# Patient Record
Sex: Male | Born: 1995 | Marital: Single | Smoking: Never smoker
Health system: Southern US, Community
[De-identification: ages and names within clinical notes are randomized; demographics above are authoritative.]

---
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
## 2016-01-07 DIAGNOSIS — M25672 Stiffness of left ankle, not elsewhere classified: Secondary | ICD-10-CM | POA: Diagnosis not present

## 2016-01-07 DIAGNOSIS — M25572 Pain in left ankle and joints of left foot: Secondary | ICD-10-CM | POA: Diagnosis not present

## 2016-01-14 DIAGNOSIS — M25572 Pain in left ankle and joints of left foot: Secondary | ICD-10-CM | POA: Diagnosis not present

## 2016-01-14 DIAGNOSIS — M25672 Stiffness of left ankle, not elsewhere classified: Secondary | ICD-10-CM | POA: Diagnosis not present

## 2016-01-17 DIAGNOSIS — M25672 Stiffness of left ankle, not elsewhere classified: Secondary | ICD-10-CM | POA: Diagnosis not present

## 2016-01-17 DIAGNOSIS — M25572 Pain in left ankle and joints of left foot: Secondary | ICD-10-CM | POA: Diagnosis not present

## 2016-01-21 DIAGNOSIS — M25672 Stiffness of left ankle, not elsewhere classified: Secondary | ICD-10-CM | POA: Diagnosis not present

## 2016-01-21 DIAGNOSIS — M25572 Pain in left ankle and joints of left foot: Secondary | ICD-10-CM | POA: Diagnosis not present

## 2016-01-24 DIAGNOSIS — M25672 Stiffness of left ankle, not elsewhere classified: Secondary | ICD-10-CM | POA: Diagnosis not present

## 2016-01-24 DIAGNOSIS — M25572 Pain in left ankle and joints of left foot: Secondary | ICD-10-CM | POA: Diagnosis not present

## 2016-01-28 DIAGNOSIS — M25572 Pain in left ankle and joints of left foot: Secondary | ICD-10-CM | POA: Diagnosis not present

## 2016-01-28 DIAGNOSIS — M25672 Stiffness of left ankle, not elsewhere classified: Secondary | ICD-10-CM | POA: Diagnosis not present

## 2016-01-31 DIAGNOSIS — M25672 Stiffness of left ankle, not elsewhere classified: Secondary | ICD-10-CM | POA: Diagnosis not present

## 2016-01-31 DIAGNOSIS — M25572 Pain in left ankle and joints of left foot: Secondary | ICD-10-CM | POA: Diagnosis not present

## 2016-02-04 DIAGNOSIS — M25572 Pain in left ankle and joints of left foot: Secondary | ICD-10-CM | POA: Diagnosis not present

## 2016-02-04 DIAGNOSIS — M25672 Stiffness of left ankle, not elsewhere classified: Secondary | ICD-10-CM | POA: Diagnosis not present

## 2016-11-09 DIAGNOSIS — R05 Cough: Secondary | ICD-10-CM | POA: Diagnosis not present

## 2016-11-09 DIAGNOSIS — J069 Acute upper respiratory infection, unspecified: Secondary | ICD-10-CM | POA: Diagnosis not present

## 2016-12-07 ENCOUNTER — Encounter: Payer: Self-pay | Admitting: Sports Medicine

## 2016-12-07 ENCOUNTER — Ambulatory Visit (INDEPENDENT_AMBULATORY_CARE_PROVIDER_SITE_OTHER): Payer: BLUE CROSS/BLUE SHIELD | Admitting: Sports Medicine

## 2016-12-07 ENCOUNTER — Ambulatory Visit (INDEPENDENT_AMBULATORY_CARE_PROVIDER_SITE_OTHER): Payer: BLUE CROSS/BLUE SHIELD

## 2016-12-07 VITALS — BP 112/70 | HR 68 | Ht 73.0 in | Wt 193.6 lb

## 2016-12-07 DIAGNOSIS — G8929 Other chronic pain: Secondary | ICD-10-CM | POA: Diagnosis not present

## 2016-12-07 DIAGNOSIS — M25572 Pain in left ankle and joints of left foot: Secondary | ICD-10-CM

## 2016-12-07 NOTE — Patient Instructions (Signed)
We will plan to call you with results of the MRI.  Please see office from any significant activity until we have more information from the MRI.  You will need to call to arrange his back home.

## 2016-12-07 NOTE — Progress Notes (Signed)
OFFICE VISIT NOTE Richard FellsMichael D. Delorise Shinerigby, DO  [place] [facility] [place] [phone]  Richard Burns - 21 y.o. male MRN [id number]  Date of birth: 1996/01/27  Visit Date: 12/07/2016  PCP: No primary care provider on file.   Referred by: No ref. provider found  Richard Burns, CMA acting as scribe for Dr. Berline Choughigby.  SUBJECTIVE:   Chief Complaint  Patient presents with  . left ankle pain   HPI: As below and per problem based documentation when appropriate.  Pt presents today with complaint of pain in the left ankle.  Pain started about 4 months ago d/t an injury while playing baseball. Pt rolled his ankle while stepping back. The pain in on the medial aspect of the left ankle. He reports mild swelling around the ankle.  Pt reports not recent xray of the ankle.   The pain is described as stabbing pain and is rated as 0/10 currently but 7/10 when running.  Worsened with running and jogging.  Improves with rest Therapies tried include icing the ankle and taping the ankle, he gets no relief from this. He also takes Ibuprofen and gets some relief.   Other associated symptoms include: Pt denies pain in foot, leg, knee, hip.   Pt denies fever, chills, night sweat, unintentional weight gain. He reports that he did lose 10 lbs over this past season but that is normal for him.     Review of Systems  Constitutional: Negative for chills and fever.  Respiratory: Negative for shortness of breath and wheezing.   Cardiovascular: Negative for chest pain, palpitations and leg swelling.  Musculoskeletal: Negative for falls.  Neurological: Negative for dizziness, tingling and headaches.  Endo/Heme/Allergies: Does not bruise/bleed easily.    Otherwise per HPI.  HISTORY & PERTINENT PRIOR DATA:  No specialty comments available. He reports that he has never smoked. He has never used smokeless tobacco. No results for input(s): HGBA1C, LABURIC in the last 8760  hours. Medications & Allergies reviewed per EMR Patient Active Problem List   Diagnosis Date Noted  . Chronic pain of left ankle 12/07/2016   History reviewed. No pertinent past medical history. History reviewed. No pertinent family history. History reviewed. No pertinent surgical history. Social History   Occupational History  . Not on file.   Social History Main Topics  . Smoking status: Never Smoker  . Smokeless tobacco: Never Used  . Alcohol use Not on file  . Drug use: Unknown  . Sexual activity: Not on file    OBJECTIVE:  VS:  HT:6\' 1"  (185.4 cm)   WT:193 lb 9.6 oz (87.8 kg)  BMI:25.6    BP:112/70  HR:68bpm  TEMP: ( )  RESP:97 % EXAM: Findings:  WDWN, NAD, Non-toxic appearing Alert & appropriately interactive Not depressed or anxious appearing No increased work of breathing. Pupils are equal. EOM intact without nystagmus No clubbing or cyanosis of the extremities appreciated No significant rashes/lesions/ulcerations overlying the examined area. DP & PT pulses 2+/4.  No significant pretibial edema. Sensation intact to light touch in lower extremities.  Left ankle: Overall moderately well aligned although he does have a midfoot valgus deformity with prominence of the medial malleolus.  He is a small amount of pain over the anterior medial ankle and crepitation that is painful with circumduction of the ankle.  He is stable to anterior drawer testing as well as talar tilt or Kleiger testing.  No pain with cotton testing.  Does have pain with resisted ankle  inversion but strength is 5/5 with dorsiflexion, plantarflexion, inversion and eversion.  Complete x-rays of the left ankle reviewed today personally by myself that show a radiolucency within the medial malleolus and possible cortical irregularity at the medial cornus of the talar dome.      No results found. ASSESSMENT & PLAN:   Problem List Items Addressed This Visit    Chronic pain of left ankle - Primary     Unclear etiology of 4 months of worsening left medial ankle pain.  There is a slight irregularity on the x-ray of the ankle today and evaluation with MRI is warranted at this time given the significant mechanical symptoms that he is experiencing in the persistent pain in spite of lack of conservative measures including anti-inflammatories, bracing and physical therapy.  We will plan to touch base with him by telephone regarding the results as he is returning home over the summer and will need to have the MRI arranged through his local medical facility.      Relevant Orders   DG Ankle Complete Left   MR ANKLE LEFT WO CONTRAST      Follow-up: Return if symptoms worsen or fail to improve, for Will call for results for the MRI since he is going home..   CMA/ATC served as Neurosurgeon during this visit. History, Physical, and Plan performed by medical provider. Documentation and orders reviewed and attested to.      Gaspar Bidding, DO    [facility]

## 2016-12-07 NOTE — Assessment & Plan Note (Signed)
Unclear etiology of 4 months of worsening left medial ankle pain.  There is a slight irregularity on the x-ray of the ankle today and evaluation with MRI is warranted at this time given the significant mechanical symptoms that he is experiencing in the persistent pain in spite of lack of conservative measures including anti-inflammatories, bracing and physical therapy.  We will plan to touch base with him by telephone regarding the results as he is returning home over the summer and will need to have the MRI arranged through his local medical facility.

## 2016-12-10 ENCOUNTER — Telehealth: Payer: Self-pay | Admitting: Sports Medicine

## 2016-12-10 NOTE — Telephone Encounter (Signed)
Victorino [facility]ennifer [facility] to speak to [facility] about [facility] for patient. Transferred to [place].

## 2016-12-10 NOTE — Telephone Encounter (Signed)
Order needed to be signed and dated by Dr Berline Choughigby. New order given to [facility].

## 2016-12-14 DIAGNOSIS — M25572 Pain in left ankle and joints of left foot: Secondary | ICD-10-CM | POA: Diagnosis not present

## 2016-12-14 DIAGNOSIS — G8929 Other chronic pain: Secondary | ICD-10-CM | POA: Diagnosis not present

## 2016-12-15 ENCOUNTER — Ambulatory Visit: Payer: No Typology Code available for payment source | Admitting: Sports Medicine

## 2016-12-21 ENCOUNTER — Encounter: Payer: Self-pay | Admitting: Sports Medicine

## 2016-12-21 ENCOUNTER — Ambulatory Visit (INDEPENDENT_AMBULATORY_CARE_PROVIDER_SITE_OTHER): Payer: BLUE CROSS/BLUE SHIELD | Admitting: Sports Medicine

## 2016-12-21 VITALS — BP 122/80 | HR 80 | Ht 73.0 in | Wt 198.2 lb

## 2016-12-21 DIAGNOSIS — G8929 Other chronic pain: Secondary | ICD-10-CM

## 2016-12-21 DIAGNOSIS — M216X2 Other acquired deformities of left foot: Secondary | ICD-10-CM

## 2016-12-21 DIAGNOSIS — M25572 Pain in left ankle and joints of left foot: Secondary | ICD-10-CM | POA: Diagnosis not present

## 2016-12-21 DIAGNOSIS — T148XXA Other injury of unspecified body region, initial encounter: Secondary | ICD-10-CM

## 2016-12-21 NOTE — Patient Instructions (Signed)
Try using a compression sock or sleeve on the ankle.  We gave you a sample of the Foot control sock from [employer].  Please let me know how this feels and does for you.  Get [employer] orthotics through [employer] [facility]  Okay to start PT with focus on intrinsic foot muscles and modalities

## 2016-12-21 NOTE — Progress Notes (Addendum)
OFFICE VISIT NOTE Veverly FellsMichael D. Delorise Shinerigby, DO  [place] [facility] [place] [phone]  Dorna MaiJoshua Burns - 21 y.o. male MRN [id number]  Date of birth: 1995/10/24  Visit Date: 12/21/2016  PCP: No primary care provider on file.   Referred by: No ref. provider found  Richard DakinBrandy Burns, CMA acting as scribe for Dr. Berline Burns.  SUBJECTIVE:   Chief Complaint  Patient presents with  . Follow-up    left ankle pain, MRI review.    HPI: As below and per problem based documentation when appropriate.  Pt presents today in follow-up of recent MRI of the left ankle. [facility]. Pt injured the left ankle while playing baseball 4-5 months ago and was seen and evaluated in our office 12/07/2016.  Results are as follows: IMPRESSION: Bone marrow edema within the medial malleolus could be stress related. No fracture seen. No definite overlying cartilage defect or osteochondral lesion in the medial ankle joint  Signal abnormality within the deltoid ligament, likely reflecting prior partial tear.  Dictated By: Richard LivelyMichael Fisher, MD 12/14/2016 9:46 AM  Electronically Signed by: Richard LivelyMichael Fisher, MD 12/14/2016 9:49 AM  Pt reports no improvement in pain or symptoms since his last visit. He denies swelling in the left ankle.   Pt denies fever, chills, night sweats.     Review of Systems  Constitutional: Negative for chills and fever.  Respiratory: Negative for shortness of breath and wheezing.   Cardiovascular: Negative for chest pain, palpitations and leg swelling.  Musculoskeletal: Positive for joint pain. Negative for falls.  Neurological: Negative for dizziness, tingling and headaches.  Endo/Heme/Allergies: Does not bruise/bleed easily.    Otherwise per HPI.  HISTORY & PERTINENT PRIOR DATA:  No specialty comments available. He reports that he has never smoked. He has never used smokeless tobacco. No results for input(s):  HGBA1C, LABURIC in the last 8760 hours. Medications & Allergies reviewed per EMR Patient Active Problem List   Diagnosis Date Noted  . Contusion of bone 12/21/2016  . Chronic pain of left ankle 12/07/2016   No past medical history on file. No family history on file. No past surgical history on file. Social History   Occupational History  . Not on file.   Social History Main Topics  . Smoking status: Never Smoker  . Smokeless tobacco: Never Used  . Alcohol use Not on file  . Drug use: Unknown  . Sexual activity: Not on file    OBJECTIVE:  VS:  HT:6\' 1"  (185.4 cm)   WT:198 lb 3.2 oz (89.9 kg)  BMI:26.2    BP:122/80  HR:80bpm  TEMP: ( )  RESP:97 % EXAM: Findings:  Ankle is overall well aligned.  His sensation is intact light touch.  DP PT pulses 2+/4.  He has some pain with resisted forefoot abduction however this is minimal.  Pain is present along the anterior medial ankle this is mild.  He has no significant laxity with ankle drawer testing or cotton testing.  MRI independently reviewed today does show a intact ATFL and bone marrow edema within the medial malleolus likely consistent with a nonhealing bone contusion.  Otherwise no significant chondral lesion appreciated or significant ligamentous injury but does appear to have some insufficiency within the deltoid ligament which is likely related to the medial ankle sprain that occurred in the time of the contusion.     No results found.  ASSESSMENT & PLAN:  Richard Burns was seen today  for follow-up.  Diagnoses and all orders for this visit:  Chronic pain of left ankle  Contusion of bone  Loss of transverse plantar arch, left   ================================================================= Chronic pain of left ankle Patient has multiple contributing factors to the pain that he is experiencing.  The MRI reviewed from [facility] is consistent with repetitive contusion secondary to poor intrinsic foot stability.  Sample  of [employer] foot doming socks provided and referral to physical therapy placed.  This will be performed at home where his father is a therapist and copy of the prescription was provided today.  We will follow-up with him once he returns to campus.  We will plan to have him start with [facility] Orthotics will be constructed for him prior to his return home from the [facility] athletic training room.   ================================================================= Follow-up: Return if symptoms worsen or fail to improve.   CMA/ATC served as Neurosurgeon during this visit. History, Physical, and Plan performed by medical provider. Documentation and orders reviewed and attested to.      Gaspar Bidding, DO    [facility]

## 2016-12-22 DIAGNOSIS — M25572 Pain in left ankle and joints of left foot: Secondary | ICD-10-CM | POA: Diagnosis not present

## 2016-12-22 DIAGNOSIS — R262 Difficulty in walking, not elsewhere classified: Secondary | ICD-10-CM | POA: Diagnosis not present

## 2016-12-22 DIAGNOSIS — M6281 Muscle weakness (generalized): Secondary | ICD-10-CM | POA: Diagnosis not present

## 2016-12-24 DIAGNOSIS — M25572 Pain in left ankle and joints of left foot: Secondary | ICD-10-CM | POA: Diagnosis not present

## 2016-12-24 DIAGNOSIS — R262 Difficulty in walking, not elsewhere classified: Secondary | ICD-10-CM | POA: Diagnosis not present

## 2016-12-24 DIAGNOSIS — M6281 Muscle weakness (generalized): Secondary | ICD-10-CM | POA: Diagnosis not present

## 2016-12-25 DIAGNOSIS — M25572 Pain in left ankle and joints of left foot: Secondary | ICD-10-CM | POA: Diagnosis not present

## 2016-12-25 DIAGNOSIS — M6281 Muscle weakness (generalized): Secondary | ICD-10-CM | POA: Diagnosis not present

## 2016-12-25 DIAGNOSIS — R262 Difficulty in walking, not elsewhere classified: Secondary | ICD-10-CM | POA: Diagnosis not present

## 2016-12-28 DIAGNOSIS — R262 Difficulty in walking, not elsewhere classified: Secondary | ICD-10-CM | POA: Diagnosis not present

## 2016-12-28 DIAGNOSIS — M25572 Pain in left ankle and joints of left foot: Secondary | ICD-10-CM | POA: Diagnosis not present

## 2016-12-28 DIAGNOSIS — M6281 Muscle weakness (generalized): Secondary | ICD-10-CM | POA: Diagnosis not present

## 2016-12-29 DIAGNOSIS — M6281 Muscle weakness (generalized): Secondary | ICD-10-CM | POA: Diagnosis not present

## 2016-12-29 DIAGNOSIS — M25572 Pain in left ankle and joints of left foot: Secondary | ICD-10-CM | POA: Diagnosis not present

## 2016-12-29 DIAGNOSIS — R262 Difficulty in walking, not elsewhere classified: Secondary | ICD-10-CM | POA: Diagnosis not present

## 2016-12-31 DIAGNOSIS — R262 Difficulty in walking, not elsewhere classified: Secondary | ICD-10-CM | POA: Diagnosis not present

## 2016-12-31 DIAGNOSIS — M25572 Pain in left ankle and joints of left foot: Secondary | ICD-10-CM | POA: Diagnosis not present

## 2016-12-31 DIAGNOSIS — M6281 Muscle weakness (generalized): Secondary | ICD-10-CM | POA: Diagnosis not present

## 2017-01-01 DIAGNOSIS — M6281 Muscle weakness (generalized): Secondary | ICD-10-CM | POA: Diagnosis not present

## 2017-01-01 DIAGNOSIS — M25572 Pain in left ankle and joints of left foot: Secondary | ICD-10-CM | POA: Diagnosis not present

## 2017-01-01 DIAGNOSIS — R262 Difficulty in walking, not elsewhere classified: Secondary | ICD-10-CM | POA: Diagnosis not present

## 2017-01-04 DIAGNOSIS — R262 Difficulty in walking, not elsewhere classified: Secondary | ICD-10-CM | POA: Diagnosis not present

## 2017-01-04 DIAGNOSIS — M6281 Muscle weakness (generalized): Secondary | ICD-10-CM | POA: Diagnosis not present

## 2017-01-04 DIAGNOSIS — M25572 Pain in left ankle and joints of left foot: Secondary | ICD-10-CM | POA: Diagnosis not present

## 2017-01-05 DIAGNOSIS — R262 Difficulty in walking, not elsewhere classified: Secondary | ICD-10-CM | POA: Diagnosis not present

## 2017-01-05 DIAGNOSIS — M6281 Muscle weakness (generalized): Secondary | ICD-10-CM | POA: Diagnosis not present

## 2017-01-05 DIAGNOSIS — M25572 Pain in left ankle and joints of left foot: Secondary | ICD-10-CM | POA: Diagnosis not present

## 2017-01-11 DIAGNOSIS — M6281 Muscle weakness (generalized): Secondary | ICD-10-CM | POA: Diagnosis not present

## 2017-01-11 DIAGNOSIS — R262 Difficulty in walking, not elsewhere classified: Secondary | ICD-10-CM | POA: Diagnosis not present

## 2017-01-11 DIAGNOSIS — M25572 Pain in left ankle and joints of left foot: Secondary | ICD-10-CM | POA: Diagnosis not present

## 2017-01-12 DIAGNOSIS — M25572 Pain in left ankle and joints of left foot: Secondary | ICD-10-CM | POA: Diagnosis not present

## 2017-01-12 DIAGNOSIS — R262 Difficulty in walking, not elsewhere classified: Secondary | ICD-10-CM | POA: Diagnosis not present

## 2017-01-12 DIAGNOSIS — M6281 Muscle weakness (generalized): Secondary | ICD-10-CM | POA: Diagnosis not present

## 2017-01-14 DIAGNOSIS — R262 Difficulty in walking, not elsewhere classified: Secondary | ICD-10-CM | POA: Diagnosis not present

## 2017-01-14 DIAGNOSIS — M25572 Pain in left ankle and joints of left foot: Secondary | ICD-10-CM | POA: Diagnosis not present

## 2017-01-14 DIAGNOSIS — M6281 Muscle weakness (generalized): Secondary | ICD-10-CM | POA: Diagnosis not present

## 2017-01-15 DIAGNOSIS — M6281 Muscle weakness (generalized): Secondary | ICD-10-CM | POA: Diagnosis not present

## 2017-01-15 DIAGNOSIS — M25572 Pain in left ankle and joints of left foot: Secondary | ICD-10-CM | POA: Diagnosis not present

## 2017-01-15 DIAGNOSIS — R262 Difficulty in walking, not elsewhere classified: Secondary | ICD-10-CM | POA: Diagnosis not present

## 2017-01-16 NOTE — Assessment & Plan Note (Signed)
Patient has multiple contributing factors to the pain that he is experiencing.  [facility] is consistent with repetitive contusion secondary to poor intrinsic foot stability.  Sample of [employer] foot doming socks provided and referral to physical therapy placed.  This will be performed at home where his father is a therapist and copy of the prescription was provided today.  We will follow-up with him once he returns to campus.  We will plan to have [facility] will [facility] for him prior to his return home from the [facility] training room.

## 2017-01-18 DIAGNOSIS — R262 Difficulty in walking, not elsewhere classified: Secondary | ICD-10-CM | POA: Diagnosis not present

## 2017-01-18 DIAGNOSIS — M25572 Pain in left ankle and joints of left foot: Secondary | ICD-10-CM | POA: Diagnosis not present

## 2017-01-18 DIAGNOSIS — M6281 Muscle weakness (generalized): Secondary | ICD-10-CM | POA: Diagnosis not present

## 2017-01-19 DIAGNOSIS — R262 Difficulty in walking, not elsewhere classified: Secondary | ICD-10-CM | POA: Diagnosis not present

## 2017-01-19 DIAGNOSIS — M6281 Muscle weakness (generalized): Secondary | ICD-10-CM | POA: Diagnosis not present

## 2017-01-19 DIAGNOSIS — M25572 Pain in left ankle and joints of left foot: Secondary | ICD-10-CM | POA: Diagnosis not present

## 2017-01-21 DIAGNOSIS — M25572 Pain in left ankle and joints of left foot: Secondary | ICD-10-CM | POA: Diagnosis not present

## 2017-01-21 DIAGNOSIS — M6281 Muscle weakness (generalized): Secondary | ICD-10-CM | POA: Diagnosis not present

## 2017-01-21 DIAGNOSIS — R262 Difficulty in walking, not elsewhere classified: Secondary | ICD-10-CM | POA: Diagnosis not present

## 2017-01-22 DIAGNOSIS — M6281 Muscle weakness (generalized): Secondary | ICD-10-CM | POA: Diagnosis not present

## 2017-01-22 DIAGNOSIS — M25572 Pain in left ankle and joints of left foot: Secondary | ICD-10-CM | POA: Diagnosis not present

## 2017-01-22 DIAGNOSIS — R262 Difficulty in walking, not elsewhere classified: Secondary | ICD-10-CM | POA: Diagnosis not present

## 2017-01-25 DIAGNOSIS — M25572 Pain in left ankle and joints of left foot: Secondary | ICD-10-CM | POA: Diagnosis not present

## 2017-01-25 DIAGNOSIS — R262 Difficulty in walking, not elsewhere classified: Secondary | ICD-10-CM | POA: Diagnosis not present

## 2017-01-25 DIAGNOSIS — M6281 Muscle weakness (generalized): Secondary | ICD-10-CM | POA: Diagnosis not present

## 2017-01-27 DIAGNOSIS — M6281 Muscle weakness (generalized): Secondary | ICD-10-CM | POA: Diagnosis not present

## 2017-01-27 DIAGNOSIS — R262 Difficulty in walking, not elsewhere classified: Secondary | ICD-10-CM | POA: Diagnosis not present

## 2017-01-27 DIAGNOSIS — M25572 Pain in left ankle and joints of left foot: Secondary | ICD-10-CM | POA: Diagnosis not present

## 2017-02-01 DIAGNOSIS — R262 Difficulty in walking, not elsewhere classified: Secondary | ICD-10-CM | POA: Diagnosis not present

## 2017-02-01 DIAGNOSIS — M6281 Muscle weakness (generalized): Secondary | ICD-10-CM | POA: Diagnosis not present

## 2017-02-01 DIAGNOSIS — M25572 Pain in left ankle and joints of left foot: Secondary | ICD-10-CM | POA: Diagnosis not present

## 2017-02-03 DIAGNOSIS — M6281 Muscle weakness (generalized): Secondary | ICD-10-CM | POA: Diagnosis not present

## 2017-02-03 DIAGNOSIS — M25572 Pain in left ankle and joints of left foot: Secondary | ICD-10-CM | POA: Diagnosis not present

## 2017-02-03 DIAGNOSIS — R262 Difficulty in walking, not elsewhere classified: Secondary | ICD-10-CM | POA: Diagnosis not present

## 2017-02-17 ENCOUNTER — Ambulatory Visit (INDEPENDENT_AMBULATORY_CARE_PROVIDER_SITE_OTHER): Payer: BLUE CROSS/BLUE SHIELD | Admitting: Sports Medicine

## 2017-02-17 ENCOUNTER — Encounter: Payer: Self-pay | Admitting: Sports Medicine

## 2017-02-17 VITALS — BP 118/72 | HR 80 | Ht 73.0 in | Wt 195.2 lb

## 2017-02-17 DIAGNOSIS — M216X2 Other acquired deformities of left foot: Secondary | ICD-10-CM

## 2017-02-17 DIAGNOSIS — M25572 Pain in left ankle and joints of left foot: Secondary | ICD-10-CM

## 2017-02-17 DIAGNOSIS — G8929 Other chronic pain: Secondary | ICD-10-CM

## 2017-02-17 DIAGNOSIS — T148XXA Other injury of unspecified body region, initial encounter: Secondary | ICD-10-CM | POA: Diagnosis not present

## 2017-02-17 NOTE — Progress Notes (Signed)
OFFICE VISIT NOTE Veverly Fells. [facility] [phone]  Aundrey Ehrhardt - 21 y.o. male MRN [id number]  Date of birth: May 07, 1996  Visit Date: 02/17/2017  PCP: No primary care provider on file.   Referred by: No ref. provider found  Orlie Dakin, CMA acting as scribe for Dr. Berline Chough.  SUBJECTIVE:   Chief Complaint  Patient presents with  . Follow-up    chronic left ankle pain, loss of transverse plantar arch   HPI: As below and per problem based documentation when appropriate.  Mr. Casares is an established patient presenting today in follow-up of chronic left ankle pain and loss of transverse plantar arch. He as last seen 12/21/2016 and provided with [employer] foot doming socks and referred to PT.   He had custom orthotics made and they are working well. He does not have very much pain in the left ankle. He still has some pain when running and jumping. He has some tenderness and tightness in the ankle the morning after being very active. He has also been doing PT and making good progress. He uses [employer] foot doming socks on occasion, he reports that they work well for compression.     Review of Systems  Constitutional: Negative for chills and fever.  Respiratory: Negative for shortness of breath and wheezing.   Cardiovascular: Negative for chest pain and palpitations.  Musculoskeletal: Positive for joint pain. Negative for falls.  Neurological: Negative for dizziness, tingling and headaches.  Endo/Heme/Allergies: Does not bruise/bleed easily.    Otherwise per HPI.  HISTORY & PERTINENT PRIOR DATA:  No specialty comments available. He reports that he has never smoked. He has never used smokeless tobacco. No results for input(s): HGBA1C, LABURIC in the last 8760 hours. Medications & Allergies reviewed per EMR Patient Active Problem List   Diagnosis Date Noted  . Contusion of bone 12/21/2016  . Chronic pain of left ankle  12/07/2016   No past medical history on file. No family history on file. No past surgical history on file. Social History   Occupational History  . Not on file.   Social History Main Topics  . Smoking status: Never Smoker  . Smokeless tobacco: Never Used  . Alcohol use Not on file  . Drug use: Unknown  . Sexual activity: Not on file    OBJECTIVE:  VS:  HT:6\' 1"  (185.4 cm)   WT:195 lb 3.2 oz (88.5 kg)  BMI:25.76    BP:118/72  HR:80bpm  TEMP: ( )  RESP:97 % EXAM: Findings:  Adult male.  No acute distress.  Alert and appropriate.  Overall his feet and ankle our normal alignment.  Sensation is intact light touch.  DP PT pulses 2+/4.  Ankle drawer testing is normal.  He has no pain with talar tilting, drawer testing, Klier testing or cotton testing.  His ankle range of motion is symmetric.  There is no appreciable click.  He does have breakdown of the transverse arch and a high, flexible cavus arch longitudinal.  Dynamic testing was performed with side-to-side motions as well as anterior and posterior jumps with only a single episode of very small discomfort with one footed jumping and landing patient rated as mild.  >50% of this 25 minute visit spent in direct patient counseling and/or coordination of care.  Dynamic intensive evaluation was performed by myself and our discussion was focused on education  regarding the pathoetiology and anticipated clinical course of the above condition.  No results found. ASSESSMENT & PLAN:     ICD-10-CM   1. Chronic pain of left ankle M25.572    G89.29   2. Contusion of bone T14.8XXA   3. Loss of transverse plantar arch, left M21.6X2   ================================================================= Chronic pain of left ankle He has progressed quite well with formal physical therapy at home as well as the custom [employer] Orthotics fabricated in the athletic training room.  He is able to demonstrate good dynamic stability and has only very  minimal pain from side to side.  Reviewed his return to running activity protocol that has been outlined by his physical therapist and agree with this.  I am okay with him beginning running/easy jogging next week.  Should continue with compression and Body Helix compression sleeve was tried on provide significant comfort.  Recommend [employer] obtain these for him. If any recurrent symptoms can consider ankle injection  ================================================================= There are no Patient Instructions on file for this visit.================================================================= No future appointments.  Follow-up: Return if symptoms worsen or fail to improve.   CMA/ATC served as Neurosurgeon during this visit. History, Physical, and Plan performed by medical provider. Documentation and orders reviewed and attested to.      Gaspar Bidding, DO    [facility]

## 2017-02-18 NOTE — Assessment & Plan Note (Signed)
He has progressed quite well with formal physical therapy at home as well as the custom [employer] Orthotics fabricated in the athletic training room.  He is able to demonstrate good dynamic stability and has only very minimal pain from side to side.  Reviewed his return to running activity protocol that has been outlined by his physical therapist and agree with this.  I am okay with him beginning running/easy jogging next week.  Should continue with compression and Body Helix compression sleeve was tried on provide significant comfort.  Recommend [employer] obtain these for him. If any recurrent symptoms can consider ankle injection

## 2018-03-17 DIAGNOSIS — R110 Nausea: Secondary | ICD-10-CM | POA: Diagnosis not present

## 2018-03-17 DIAGNOSIS — R197 Diarrhea, unspecified: Secondary | ICD-10-CM | POA: Diagnosis not present

## 2018-12-16 DIAGNOSIS — Z20828 Contact with and (suspected) exposure to other viral communicable diseases: Secondary | ICD-10-CM | POA: Diagnosis not present

## 2019-10-16 ENCOUNTER — Ambulatory Visit (INDEPENDENT_AMBULATORY_CARE_PROVIDER_SITE_OTHER): Payer: BLUE CROSS/BLUE SHIELD | Admitting: Orthopedic Surgery

## 2019-10-16 DIAGNOSIS — M25561 Pain in right knee: Secondary | ICD-10-CM

## 2019-10-24 ENCOUNTER — Other Ambulatory Visit: Payer: Self-pay

## 2019-10-25 ENCOUNTER — Encounter: Payer: Self-pay | Admitting: Orthopedic Surgery

## 2019-10-25 NOTE — Progress Notes (Signed)
   Post-Op Visit Note   Patient: Richard Burns           Date of Birth: Jun 18, 1996           MRN: [id number] Visit Date: 10/16/2019 PCP: Patient, No Pcp Per   Assessment & Plan:  Chief Complaint: No chief complaint on file.  Visit Diagnoses:  1. 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  Injured his knee about 8 days ago.  He plays first base.  Stretches to catch.  Had a hyperextension injury which was manageable.  When he was batting in the same game he planted and felt a pop in the posterior aspect of his knee.  He was able to continue to play.  Reports pain primarily posteriorly.  Stairs are okay.  Today he was able to run the bases.  Denies any instability or effusion.  On examination he has normal gait alignment.  No effusion in that right or left knee.  Collateral and cruciate ligaments are stable.  Hamstring tendons palpable nontender and intact.  Extensor mechanism is intact.  No real pain with plantar flexion.  Impression is hyperextension injury without effusion with stable collateral cruciate ligaments.  May be a capsular strain or potential gastroc strain posteriorly.  I would favor observation with naproxen 500 mg twice a day for 7 days.  If is not improved or develops mechanical symptoms or effusion he should come back in for repeat evaluation.  No limp today.  Follow-Up Instructions: Return if symptoms worsen or fail to improve.   Orders:  No orders of the defined types were placed in this encounter.  No orders of the defined types were placed in this encounter.   Imaging: No results found.  PMFS History: Patient Active Problem List   Diagnosis Date Noted  . Contusion of bone 12/21/2016  . Chronic pain of left ankle 12/07/2016   History reviewed. No pertinent past medical history.  History reviewed. No pertinent family history.  History reviewed. No pertinent surgical history. Social History    Occupational History  . Not on file  Tobacco Use  . Smoking status: Never Smoker  . Smokeless tobacco: Never Used  Substance and Sexual Activity  . Alcohol use: Not on file  . Drug use: Not on file  . Sexual activity: Not on file

## 2019-11-08 ENCOUNTER — Ambulatory Visit (INDEPENDENT_AMBULATORY_CARE_PROVIDER_SITE_OTHER): Payer: BC Managed Care – PPO | Admitting: Orthopedic Surgery

## 2019-11-08 ENCOUNTER — Ambulatory Visit: Payer: Self-pay

## 2019-11-08 ENCOUNTER — Other Ambulatory Visit: Payer: Self-pay

## 2019-11-08 DIAGNOSIS — M25571 Pain in right ankle and joints of right foot: Secondary | ICD-10-CM | POA: Diagnosis not present

## 2019-11-11 ENCOUNTER — Encounter: Payer: Self-pay | Admitting: Orthopedic Surgery

## 2019-11-11 NOTE — Progress Notes (Signed)
Office Visit Note   Patient: Richard Burns           Date of Birth: [birth year]           MRN: [id number] Visit Date: 11/08/2019 Requested by: No referring provider defined for this encounter. PCP: Patient, No Pcp Per  Subjective: Chief Complaint  Patient presents with  . Right Ankle - Pain    HPI: Richard Burns is a 24 year old patient with right ankle pain.  Date of injury 11/02/2019.  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  There was no inversion or eversion type of injury.  This is more of a planting type injury and then he localized the pain to be anterolateral aspect of the ankle.  He was able to hobble to first base.  Difficult for him to weight-bear.  He did to some degree play through the pain.  Has had significant symptoms since that time.  Describes new mechanical symptoms in the ankle localizing to the sinus Tarsi region.  He has been ambulating full weightbearing in a fracture boot.              ROS: All systems reviewed are negative as they relate to the chief complaint within the history of present illness.  Patient denies  fevers or chills.   Assessment & Plan: Visit Diagnoses:  1. Pain in right ankle and joints of right foot     Plan: Impression is right ankle pain with some clicking on exam in the sinus Tarsi region.  No real ATFL or CFL ligament tenderness.  Ankle exam and radiographs are normal except for the sinus Tarsi mechanical symptoms.  Looked at it with ultrasound and nothing was definitive.  This is an unusual injury.  Could be some type of cystic structure within the sinus Tarsi region or some type of ankle impingement versus lateral talar dome lesion.  Plan is progressive rehab with continuation of anti-inflammatories.  Need MRI scan of the ankle in order to facilitate decisions about return to play.  I think he does have mechanical obstruction to motion which is interfering with his  weightbearing.  Follow-up after that study.  Follow-Up Instructions: Return for after MRI.   Orders:  Orders Placed This Encounter  Procedures  . XR Ankle Complete Right  . MR Ankle Right w/o contrast   No orders of the defined types were placed in this encounter.     Procedures: No procedures performed   Clinical Data: No additional findings.  Objective: Vital Signs: There were no vitals taken for this visit.  Physical Exam:   Constitutional: Patient appears well-developed HEENT:  Head: Normocephalic Eyes:EOM are normal Neck: Normal range of motion Cardiovascular: Normal rate Pulmonary/chest: Effort normal Neurologic: Patient is alert Skin: Skin is warm Psychiatric: Patient has normal mood and affect    Ortho Exam: Ortho exam demonstrates full active and passive range of motion of the right ankle.  Does have sinus Tarsi tenderness.  Palpable intact nontender anterior to posterior to peroneal Achilles tendons.  Syndesmosis stable.  No real increased instability with varus tilt testing or anterior drawer testing right versus left.  No bruising or swelling.  Does have definite clicking in the sinus Tarsi region with inversion and eversion.  No ankle effusion.  Specialty Comments:  No specialty comments available.  Imaging: No results found.   PMFS History: Patient Active Problem List   Diagnosis Date Noted  .  Contusion of bone 12/21/2016  . Chronic pain of left ankle 12/07/2016   History reviewed. No pertinent past medical history.  History reviewed. No pertinent family history.  History reviewed. No pertinent surgical history. Social History   Occupational History  . Not on file  Tobacco Use  . Smoking status: Never Smoker  . Smokeless tobacco: Never Used  Substance and Sexual Activity  . Alcohol use: Not on file  . Drug use: Not on file  . Sexual activity: Not on file

## 2019-11-13 ENCOUNTER — Other Ambulatory Visit: Payer: Self-pay

## 2019-11-13 ENCOUNTER — Ambulatory Visit
Admission: RE | Admit: 2019-11-13 | Discharge: 2019-11-13 | Disposition: A | Discharge status: Home / Self Care | Payer: BC Managed Care – PPO | Source: Ambulatory Visit | Attending: Orthopedic Surgery | Admitting: Orthopedic Surgery

## 2019-11-13 DIAGNOSIS — M25571 Pain in right ankle and joints of right foot: Secondary | ICD-10-CM

## 2019-11-21 ENCOUNTER — Other Ambulatory Visit: Payer: Self-pay

## 2019-11-21 ENCOUNTER — Ambulatory Visit (INDEPENDENT_AMBULATORY_CARE_PROVIDER_SITE_OTHER): Payer: BC Managed Care – PPO | Admitting: Orthopedic Surgery

## 2019-11-21 DIAGNOSIS — S93401A Sprain of unspecified ligament of right ankle, initial encounter: Secondary | ICD-10-CM

## 2019-11-22 ENCOUNTER — Encounter: Payer: Self-pay | Admitting: Orthopedic Surgery

## 2019-11-22 NOTE — Progress Notes (Signed)
   Office Visit Note   Patient: Richard Burns           Date of Birth: 07-16-1995           MRN: [id number] Visit Date: 11/21/2019              Requested by: No referring provider defined for this encounter. PCP: Patient, No Pcp Per  Chief Complaint  Patient presents with  . 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  Assessment & Plan: Visit Diagnoses:  1. Mild sprain of right ankle, initial encounter     Plan: Patient was given instructions for plantar fascial strengthening ankle strengthening proprioception advanced activities as tolerated  Follow-Up Instructions: Return if symptoms worsen or fail to improve.   Ortho Exam  Patient is alert, oriented, no adenopathy, well-dressed, normal affect, normal respiratory effort. Examination patient has good pulses he has good ankle good subtalar motion.  The syndesmosis is nontender to compression.  Proximal fibula is nontender to palpation.  Ankle joint is nontender to palpation the peroneal tendons Achilles tendon and posterior tibial tendon are nontender to palpation.  Patient has a negative anterior drawer and the anterior drawer is equal to the opposite side.  He is point tender to palpation over the anterior talofibular ligament.  Review of the patient's MRI scan shows some edema in the medial malleolus however this is asymptomatic the remainder the MRI scan is essentially normal.  The MRI scan is suggestive of a subtalar bar however the patient has good pain-free subtalar range of motion.  Imaging: No results found. No images are attached to the encounter.  Labs: No results found for: HGBA1C, ESRSEDRATE, CRP, LABURIC, REPTSTATUS, GRAMSTAIN, CULT, LABORGA   No  results found for: ALBUMIN, PREALBUMIN, LABURIC  No results found for: MG No results found for: VD25OH  No results found for: PREALBUMIN No flowsheet data found.   There is no height or weight on file to calculate BMI.  Orders:  No orders of the defined types were placed in this encounter.  No orders of the defined types were placed in this encounter.    Procedures: No procedures performed  Clinical Data: No additional findings.  ROS:  All other systems negative, except as noted in the HPI. Review of Systems  Objective: Vital Signs: There were no vitals taken for this visit.  Specialty Comments:  No specialty comments available.  PMFS History: Patient Active Problem List   Diagnosis Date Noted  . Contusion of bone 12/21/2016  . Chronic pain of left ankle 12/07/2016   History reviewed. No pertinent past medical history.  History reviewed. No pertinent family history.  History reviewed. No pertinent surgical history. Social History   Occupational History  . Not on file  Tobacco Use  . Smoking status: Never Smoker  . Smokeless tobacco: Never Used  Substance and Sexual Activity  . Alcohol use: Not on file  . Drug use: Not on file  . Sexual activity: Not on file

## 2019-11-28 ENCOUNTER — Ambulatory Visit: Payer: BC Managed Care – PPO | Admitting: Orthopedic Surgery

## 2019-12-06 ENCOUNTER — Other Ambulatory Visit: Payer: BLUE CROSS/BLUE SHIELD

## 2020-07-16 IMAGING — MR MR ANKLE*R* W/O CM
5 series · 36 of 40 positions shown · non-contrast
Comparison: Radiographs 11/08/2019

CLINICAL DATA: Injured ankle 2 weeks ago playing baseball.
Anterolateral ankle pain.

EXAM:
MRI OF THE RIGHT ANKLE WITHOUT CONTRAST
TECHNIQUE: Multiplanar, multisequence MR imaging of the ankle was performed. No
intravenous contrast was administered.

[Series 4: T2 fat-sat · axial · 3.0mm · 0.50mm/px · z∈[-103,+18]mm · 9 of 32 slices shown (1 of 2)]
[im 1/32]
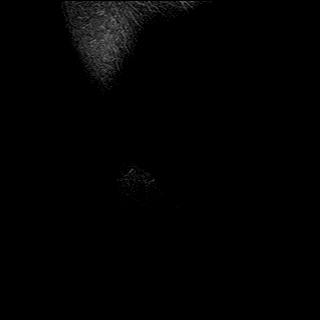
[im 4/32]
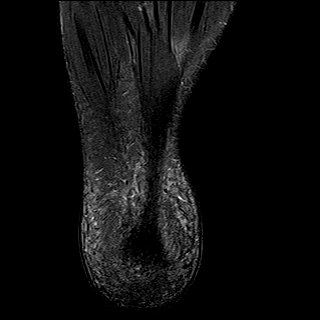
[im 8/32]
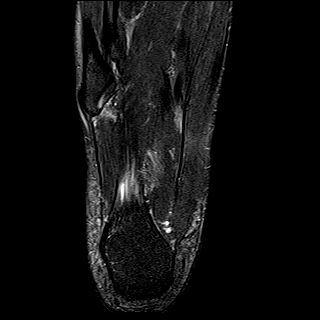
[im 12/32]
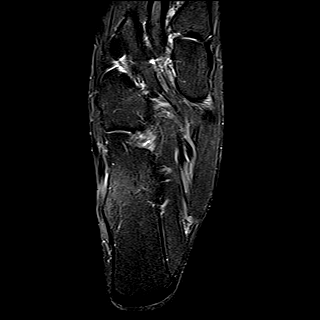
[im 16/32]
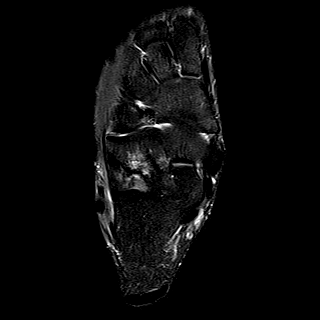
[im 20/32]
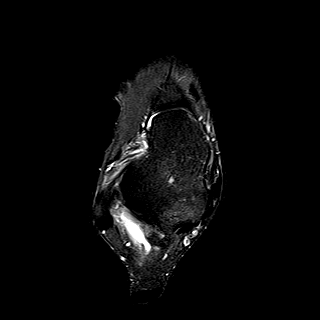
[im 24/32]
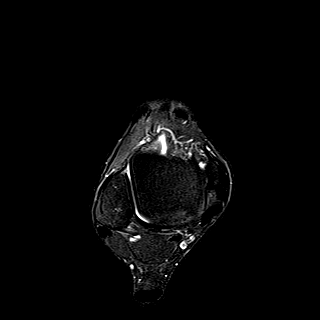
[im 28/32]
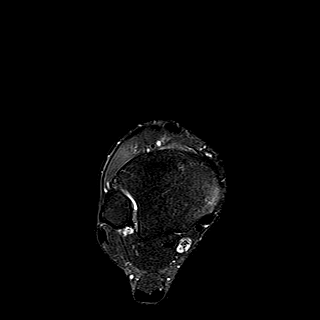
[im 32/32]
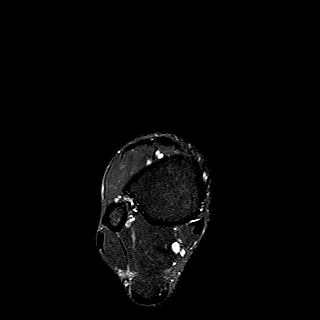

[Series 5: PD fat-sat · axial · 3.0mm · 0.42mm/px · z∈[-103,+18]mm · 9 of 32 slices shown]
[im 1/32]
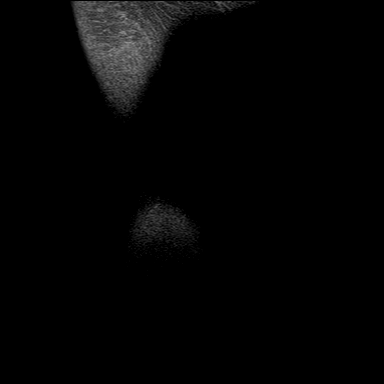
[im 4/32]
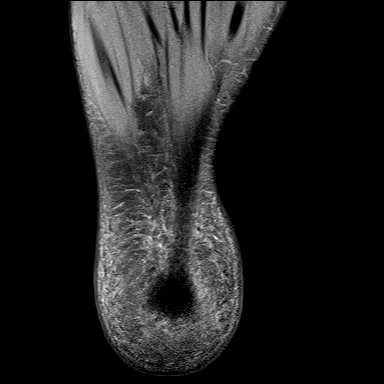
[im 8/32]
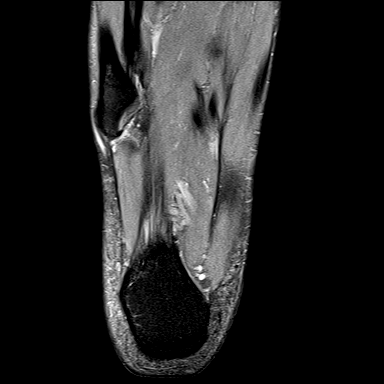
[im 12/32]
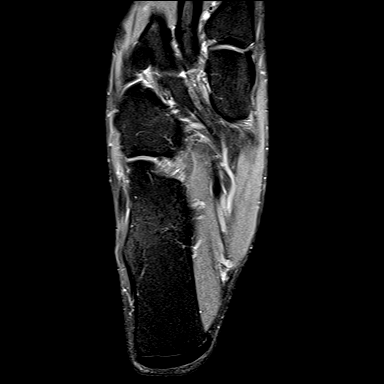
[im 16/32]
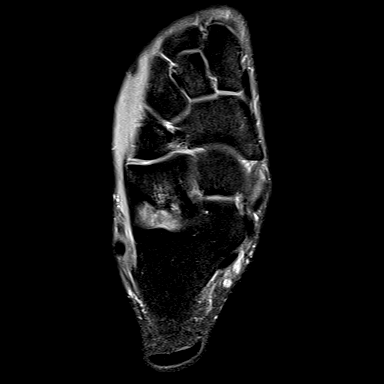
[im 20/32]
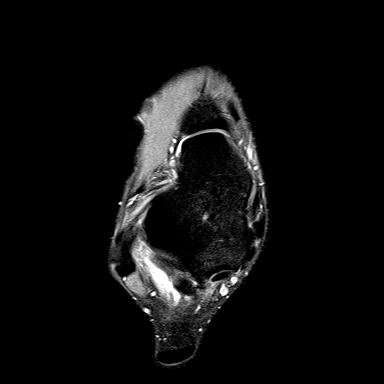
[im 24/32]
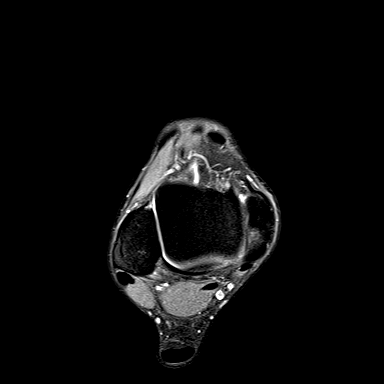
[im 28/32]
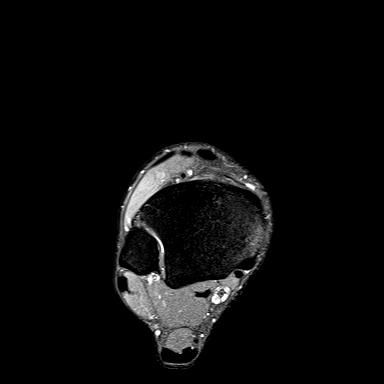
[im 32/32]
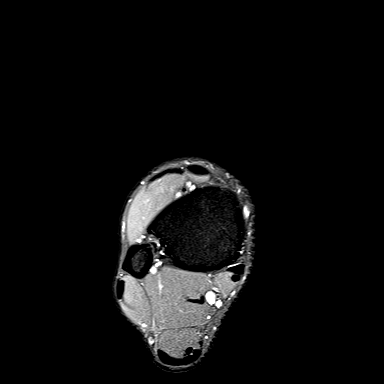

[Series 6: T1 · sagittal · 4.0mm · 0.56mm/px · 6 of 20 slices shown]
[im 1/20]
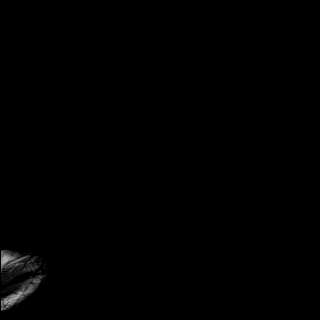
[im 4/20]
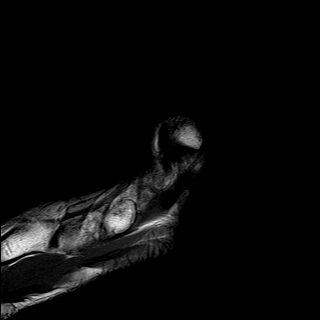
[im 8/20]
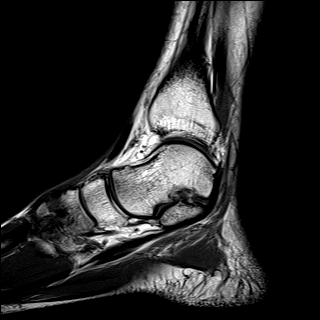
[im 12/20]
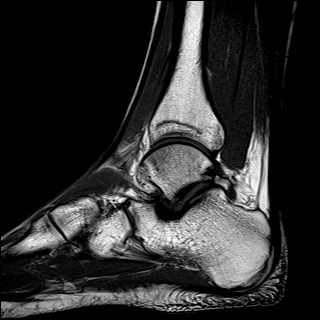
[im 16/20]
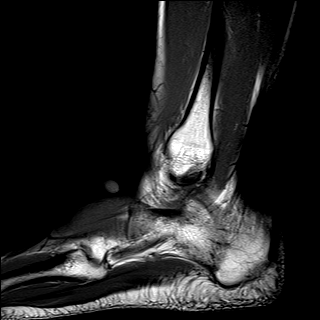
[im 20/20]
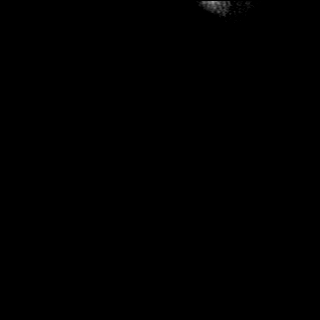

[Series 7: STIR · sagittal · 4.0mm · 0.35mm/px · 4 of 20 slices shown]
[im 1/20]
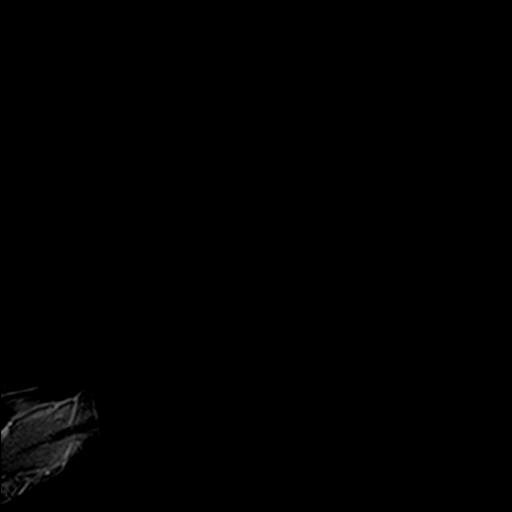
[im 4/20]
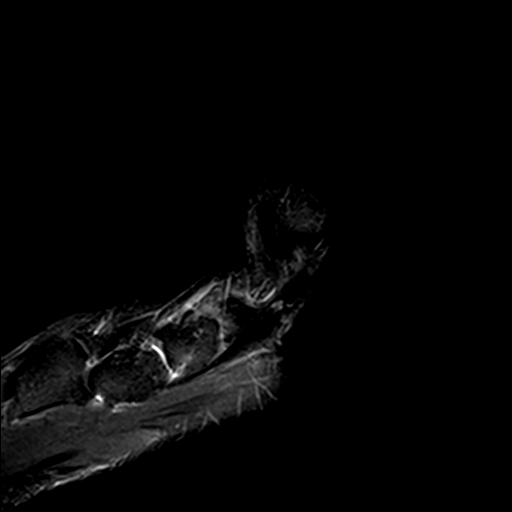
[im 8/20]
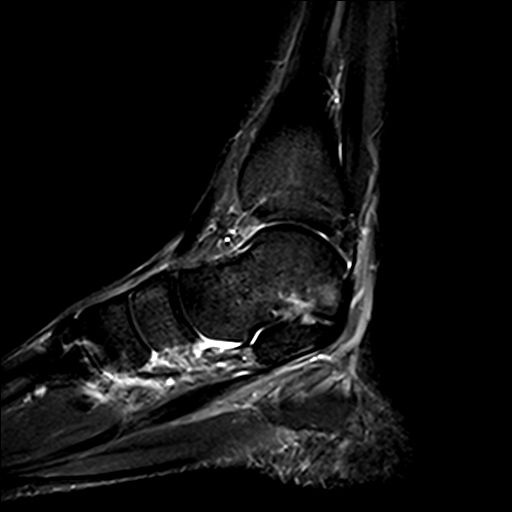
[im 12/20]
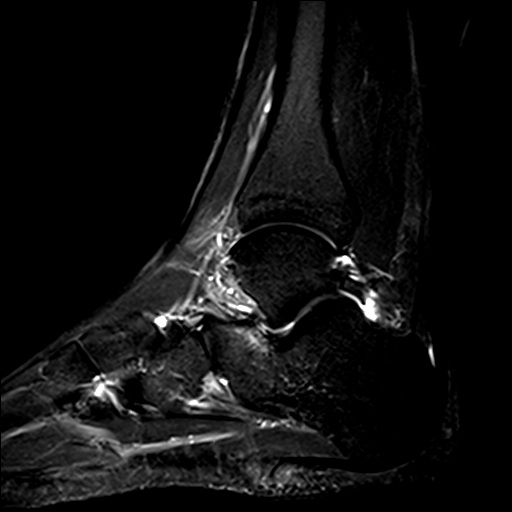

[Series 8: T2 fat-sat · coronal · 3.0mm · 0.50mm/px · 8 of 37 slices shown (2 of 2)]
[im 1/37]
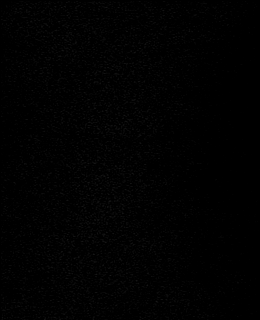
[im 5/37]
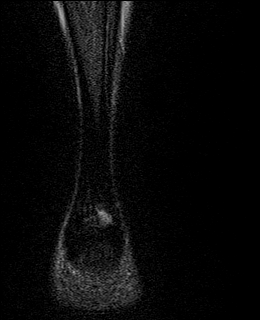
[im 13/37]
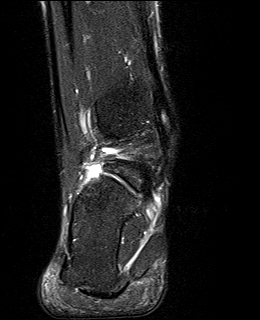
[im 17/37]
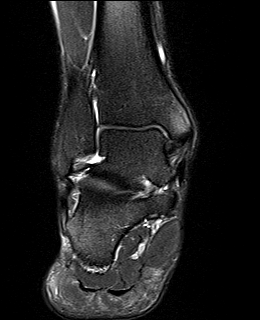
[im 21/37]
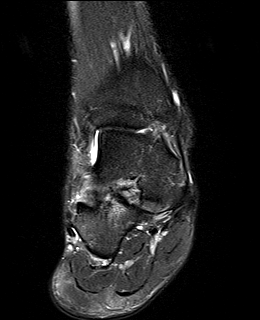
[im 25/37]
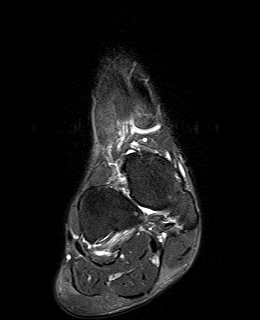
[im 33/37]
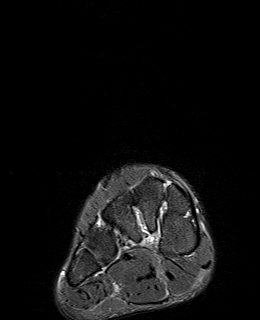
[im 37/37]
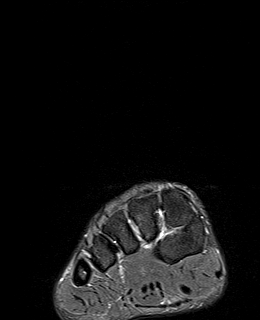

[36 of 40 positions shown; findings below may reference images not displayed]

FINDINGS: TENDONS

Peroneal: Intact. No tendinopathy or tenosynovitis.

Posteromedial: Intact. No significant tendinopathy or tenosynovitis.

Anterior: Intact. No significant tendinopathy or tenosynovitis.

Achilles: Normal.  Minimal retrocalcaneal bursitis.

Plantar Fascia: Intact

LIGAMENTS

Lateral: Intact

Medial: Intact

CARTILAGE

Ankle Joint: The tibiotalar joint is normal. No cartilage defect or
osteochondral lesion. No joint effusion.

Subtalar Joints/Sinus Tarsi: There is some fluid in the posterior
talocalcaneal facet but the articular cartilage appears normal.

I do not see much of the joint at the sustentacular limb. Some
subchondral cystic type degenerative changes near the anterior
process of the calcaneus. There is some fluid/edema in the sinus
tarsi but the cervical and interosseous ligaments appear intact. The
spring ligament appears intact. No talar beaking or pes planus. Os
trigonum noted.

Bones: Mild edema like signal changes involving the medial malleolus
be a bone contusion or stress reaction.

Other: Unremarkable foot and ankle musculature.
IMPRESSION: 1. Intact medial and lateral ankle ligaments and tendons.
2. Mild edema like signal changes in the medial malleolus be a bone
contusion or stress reaction.
3. Abnormal appearance of the middle talocalcaneal facet with a
narrowed slightly irregular joint. Could not exclude some type of
fibrous union. I do not see any definite talar beaking or pes planus
but there is some mild reactive marrow edema in the calcaneus. CT
may be helpful for further evaluation.
